# Patient Record
Sex: Male | Born: 1995 | Race: Black or African American | Hispanic: No | Marital: Single
Health system: Southern US, Community
[De-identification: ages and names within clinical notes are randomized; demographics above are authoritative.]

---

## 2019-02-04 ENCOUNTER — Emergency Department (HOSPITAL_COMMUNITY): Payer: Medicare Other

## 2019-02-04 ENCOUNTER — Emergency Department (HOSPITAL_COMMUNITY)
Admission: EM | Admit: 2019-02-04 | Discharge: 2019-02-05 | Disposition: A | Discharge status: Home / Self Care | Payer: Medicare Other | Attending: Emergency Medicine | Admitting: Emergency Medicine

## 2019-02-04 ENCOUNTER — Other Ambulatory Visit: Payer: Self-pay

## 2019-02-04 DIAGNOSIS — Z5900 Homelessness unspecified: Secondary | ICD-10-CM

## 2019-02-04 DIAGNOSIS — F329 Major depressive disorder, single episode, unspecified: Secondary | ICD-10-CM | POA: Diagnosis not present

## 2019-02-04 DIAGNOSIS — S62339A Displaced fracture of neck of unspecified metacarpal bone, initial encounter for closed fracture: Secondary | ICD-10-CM

## 2019-02-04 DIAGNOSIS — Y929 Unspecified place or not applicable: Secondary | ICD-10-CM | POA: Insufficient documentation

## 2019-02-04 DIAGNOSIS — Y999 Unspecified external cause status: Secondary | ICD-10-CM | POA: Diagnosis not present

## 2019-02-04 DIAGNOSIS — W2209XA Striking against other stationary object, initial encounter: Secondary | ICD-10-CM | POA: Diagnosis not present

## 2019-02-04 DIAGNOSIS — S6991XA Unspecified injury of right wrist, hand and finger(s), initial encounter: Secondary | ICD-10-CM | POA: Diagnosis present

## 2019-02-04 DIAGNOSIS — Y9389 Activity, other specified: Secondary | ICD-10-CM | POA: Diagnosis not present

## 2019-02-04 DIAGNOSIS — S62346A Nondisplaced fracture of base of fifth metacarpal bone, right hand, initial encounter for closed fracture: Secondary | ICD-10-CM | POA: Diagnosis not present

## 2019-02-04 LAB — CBC WITH DIFFERENTIAL/PLATELET
Abs Immature Granulocytes: 0.07 10*3/uL (ref 0.00–0.07)
Basophils Absolute: 0.1 10*3/uL (ref 0.0–0.1)
Basophils Relative: 1 %
Eosinophils Absolute: 0.1 10*3/uL (ref 0.0–0.5)
Eosinophils Relative: 1 %
HCT: 42.7 % (ref 39.0–52.0)
Hemoglobin: 13.2 g/dL (ref 13.0–17.0)
Immature Granulocytes: 1 %
Lymphocytes Relative: 25 %
Lymphs Abs: 2.2 10*3/uL (ref 0.7–4.0)
MCH: 25.6 pg — ABNORMAL LOW (ref 26.0–34.0)
MCHC: 30.9 g/dL (ref 30.0–36.0)
MCV: 82.8 fL (ref 80.0–100.0)
Monocytes Absolute: 0.6 10*3/uL (ref 0.1–1.0)
Monocytes Relative: 7 %
Neutro Abs: 5.6 10*3/uL (ref 1.7–7.7)
Neutrophils Relative %: 65 %
Platelets: 324 10*3/uL (ref 150–400)
RBC: 5.16 MIL/uL (ref 4.22–5.81)
RDW: 15.2 % (ref 11.5–15.5)
WBC: 8.7 10*3/uL (ref 4.0–10.5)
nRBC: 0 % (ref 0.0–0.2)

## 2019-02-04 LAB — COMPREHENSIVE METABOLIC PANEL
ALT: 25 U/L (ref 0–44)
AST: 25 U/L (ref 15–41)
Albumin: 3.8 g/dL (ref 3.5–5.0)
Alkaline Phosphatase: 97 U/L (ref 38–126)
Anion gap: 8 (ref 5–15)
BUN: 7 mg/dL (ref 6–20)
CO2: 22 mmol/L (ref 22–32)
Calcium: 9.1 mg/dL (ref 8.9–10.3)
Chloride: 110 mmol/L (ref 98–111)
Creatinine, Ser: 0.98 mg/dL (ref 0.61–1.24)
GFR calc Af Amer: >60 mL/min (ref >60)
GFR calc non Af Amer: >60 mL/min (ref >60)
Glucose, Bld: 98 mg/dL (ref 70–99)
Potassium: 3.9 mmol/L (ref 3.5–5.1)
Sodium: 140 mmol/L (ref 135–145)
Total Bilirubin: 0.4 mg/dL (ref 0.3–1.2)
Total Protein: 8.1 g/dL (ref 6.5–8.1)

## 2019-02-04 LAB — RAPID URINE DRUG SCREEN, HOSP PERFORMED
Amphetamines: NOT DETECTED
Barbiturates: NOT DETECTED
Benzodiazepines: NOT DETECTED
Cocaine: NOT DETECTED
Opiates: NOT DETECTED
Tetrahydrocannabinol: NOT DETECTED

## 2019-02-04 LAB — ETHANOL: Alcohol, Ethyl (B): <10 mg/dL (ref <10)

## 2019-02-04 MED ORDER — ACETAMINOPHEN 500 MG PO TABS
1000.0000 mg | ORAL_TABLET | Freq: Once | ORAL | Status: AC
  Administered 2019-02-04: 23:00:00 1000 mg via ORAL
  Filled 2019-02-04: qty 2
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
## 2019-02-04 NOTE — ED Triage Notes (Signed)
Pt here from home with c/o feeling depressed , no SI or HI , pt states that he just feels "down and out "

## 2019-02-05 ENCOUNTER — Emergency Department (HOSPITAL_COMMUNITY)
Admission: EM | Admit: 2019-02-05 | Discharge: 2019-02-05 | Disposition: A | Discharge status: Home / Self Care | Payer: Medicare Other | Source: Home / Self Care | Attending: Emergency Medicine | Admitting: Emergency Medicine

## 2019-02-05 ENCOUNTER — Other Ambulatory Visit: Payer: Self-pay

## 2019-02-05 DIAGNOSIS — S62339D Displaced fracture of neck of unspecified metacarpal bone, subsequent encounter for fracture with routine healing: Secondary | ICD-10-CM

## 2019-02-05 DIAGNOSIS — Z5900 Homelessness unspecified: Secondary | ICD-10-CM

## 2019-02-05 DIAGNOSIS — G8911 Acute pain due to trauma: Secondary | ICD-10-CM | POA: Insufficient documentation

## 2019-02-05 DIAGNOSIS — S62346A Nondisplaced fracture of base of fifth metacarpal bone, right hand, initial encounter for closed fracture: Secondary | ICD-10-CM | POA: Diagnosis not present

## 2019-02-05 NOTE — Discharge Instructions (Addendum)
You were seen in the ER for right hand pain.  X-ray showed a fracture of your right metacarpal.  This is called a boxer's fracture.  The treatment for this includes a hand splint.  Wear this and do not get it wet until you are evaluated by the hand specialist.  Call Dr. Lequita Asal office tomorrow to make an appointment. For pain and inflammation you can use a combination of ibuprofen and acetaminophen.  Take [phone] mg acetaminophen (tylenol) every 6 hours or 600 mg ibuprofen (advil, motrin) every 6 hours.  You can take these separately or combine them every 6 hours for maximum pain control. Do not exceed 4,000 mg acetaminophen or 2,400 mg ibuprofen in a 24 hour period.  Do not take ibuprofen containing products if you have history of kidney disease, ulcers, GI bleeding, severe acid reflux, or take a blood thinner.  Do not take acetaminophen if you have liver disease.   Unfortunately, social work was not available at this time of the night.  We have given you some resources that he can use in the community.

## 2019-02-05 NOTE — ED Notes (Addendum)
Pt. States seeking placement. Pt. Was discharged yesterday to seek shelter at [facility] in [place]. Per pt, when arrived was informed shelter would not be accepting any new residents for the next 3 days. Pt returns to the ED seeking shelter.  Pt. Was seen ED yesterday due to right hand injury DX closed boxer's fracture. At this time pt. Denies any pain. Pt. States "my hand still feels numb."

## 2019-02-05 NOTE — ED Notes (Signed)
Patient verbalizes understanding of discharge instructions. Opportunity for questioning and answers were provided. Armband removed by staff, pt discharged from ED.  

## 2019-02-05 NOTE — ED Provider Notes (Signed)
[place] EMERGENCY DEPARTMENT Provider Note   CSN: [id number] Arrival date & time: 02/05/19  0340    History   Chief Complaint No chief complaint on file.   HPI Joshua Clarke is a 23 y.o. male.     Patient presents to the ER stating that he needs a place to stay.  Patient was seen in the ER earlier today for boxer's fracture of the right hand.  At that time he related that he was homeless and he was given a cab voucher to be taken to [facility].  He reports that when he got there, he was informed that they were full and he could not stay there.  He has come back seeking help with his homelessness.  He reports that he has been walking most of the day and his hand hurts.     No past medical history on file.  There are no active problems to display for this patient.         Home Medications    Prior to Admission medications   Not on File    Family History No family history on file.  Social History Social History   Tobacco Use  . Smoking status: Not on file  Substance Use Topics  . Alcohol use: Not on file  . Drug use: Not on file     Allergies   Patient has no allergy information on record.   Review of Systems Review of Systems  Musculoskeletal: Positive for arthralgias.  All other systems reviewed and are negative.    Physical Exam Updated Vital Signs BP 131/82   Pulse (!) 106   Resp 19   SpO2 98%   Physical Exam Vitals signs and nursing note reviewed.  Constitutional:      General: He is not in acute distress.    Appearance: Normal appearance. He is well-developed.  HENT:     Head: Normocephalic and atraumatic.     Right Ear: Hearing normal.     Left Ear: Hearing normal.     Nose: Nose normal.  Eyes:     Conjunctiva/sclera: Conjunctivae normal.     Pupils: Pupils are equal, round, and reactive to light.  Neck:     Musculoskeletal: Normal range of motion and neck supple.  Cardiovascular:     Rate and Rhythm:  Regular rhythm.     Heart sounds: S1 normal and S2 normal. No murmur. No friction rub. No gallop.   Pulmonary:     Effort: Pulmonary effort is normal. No respiratory distress.     Breath sounds: Normal breath sounds.  Chest:     Chest wall: No tenderness.  Abdominal:     General: Bowel sounds are normal.     Palpations: Abdomen is soft.     Tenderness: There is no abdominal tenderness. There is no guarding or rebound. Negative signs include Murphy's sign and McBurney's sign.     Hernia: No hernia is present.  Musculoskeletal: Normal range of motion.     Comments: Right hand and wrist splinted  Skin:    General: Skin is warm and dry.     Findings: No rash.  Neurological:     Mental Status: He is alert and oriented to person, place, and time.     GCS: GCS eye subscore is 4. GCS verbal subscore is 5. GCS motor subscore is 6.     Cranial Nerves: No cranial nerve deficit.     Sensory: No sensory deficit.  Coordination: Coordination normal.  Psychiatric:        Speech: Speech normal.        Behavior: Behavior normal.        Thought Content: Thought content normal.      ED Treatments / Results  Labs (all labs ordered are listed, but only abnormal results are displayed) Labs Reviewed - No data to display  EKG None  Radiology Dg Hand Complete Right  Result Date: 02/04/2019 CLINICAL DATA:  Pain. EXAM: RIGHT HAND - COMPLETE 3+ VIEW COMPARISON:  None. FINDINGS: There is an acute angulated fracture involving the distal fifth metacarpal. There is surrounding soft tissue swelling. There is no dislocation. There is no radiopaque foreign body. IMPRESSION: Acute angulated boxer's fracture of the fifth metacarpal Electronically Signed   By: Katherine Mantlehristopher  Green M.D.   On: 02/04/2019 22:37    Procedures Procedures (including critical care time)  Medications Ordered in ED Medications - No data to display   Initial Impression / Assessment and Plan / ED Course  I have reviewed the  triage vital signs and the nursing notes.  Pertinent labs & imaging results that were available during my care of the patient were reviewed by me and considered in my medical decision making (see chart for details).        Patient presents with homelessness.  Attempts to help him earlier today were not successful.  He will be held here in the ER until morning at which time social work can help him find a shelter.  Final Clinical Impressions(s) / ED Diagnoses   Final diagnoses:  Homeless  Closed boxer's fracture with routine healing, subsequent encounter    ED Discharge Orders    None       Shakti Fleer, Canary Brimhristopher J, MD 02/05/19 [phone]

## 2019-02-05 NOTE — ED Notes (Signed)
Pt d/c with belongings, taken to [place] by CM.

## 2019-02-05 NOTE — ED Triage Notes (Signed)
Pt. Arrives seeking resources for shelter
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
## 2019-02-05 NOTE — ED Notes (Signed)
Lunch tray ordered 
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
## 2019-02-05 NOTE — ED Notes (Signed)
SW/CM called Dr. Tyrone Nine to d/c pt.

## 2020-11-27 IMAGING — DX RIGHT HAND - COMPLETE 3+ VIEW
1 series · 3 of 3 positions shown · non-contrast
Comparison: None.

CLINICAL DATA: Pain.

EXAM:
RIGHT HAND - COMPLETE 3+ VIEW

[Series 1: hand · 0.14mm/px · 3 of 3 slices shown]
[im 1/3]
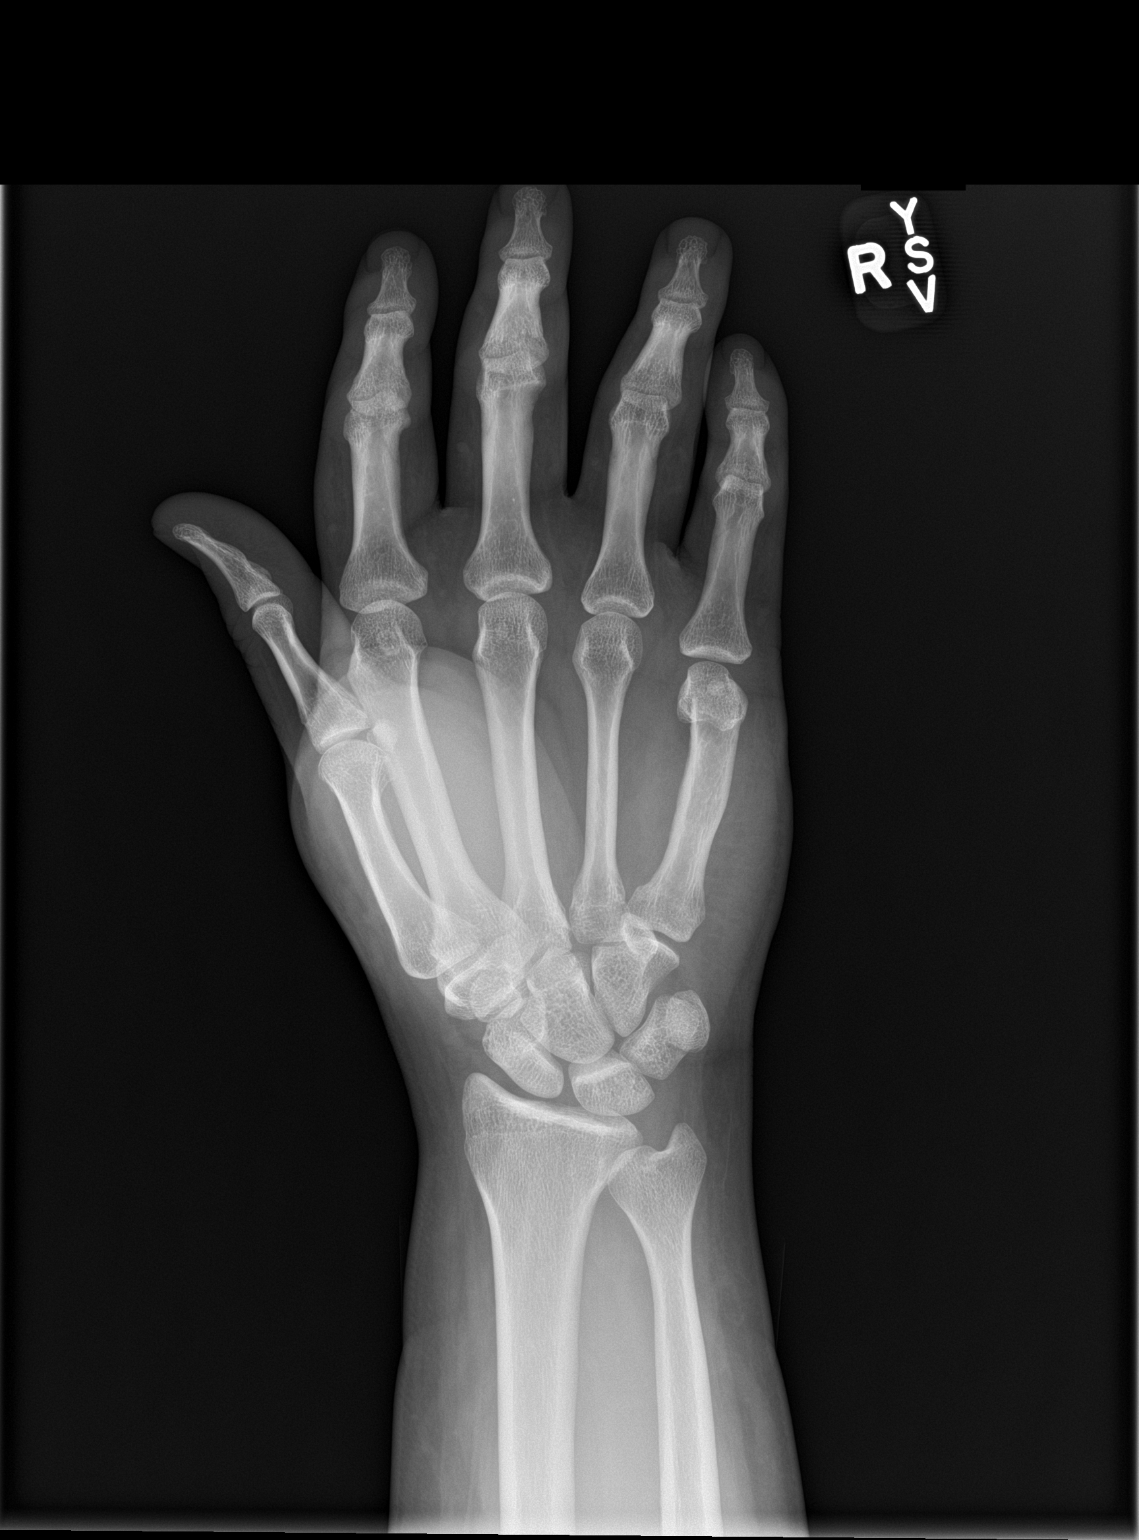
[im 2/3]
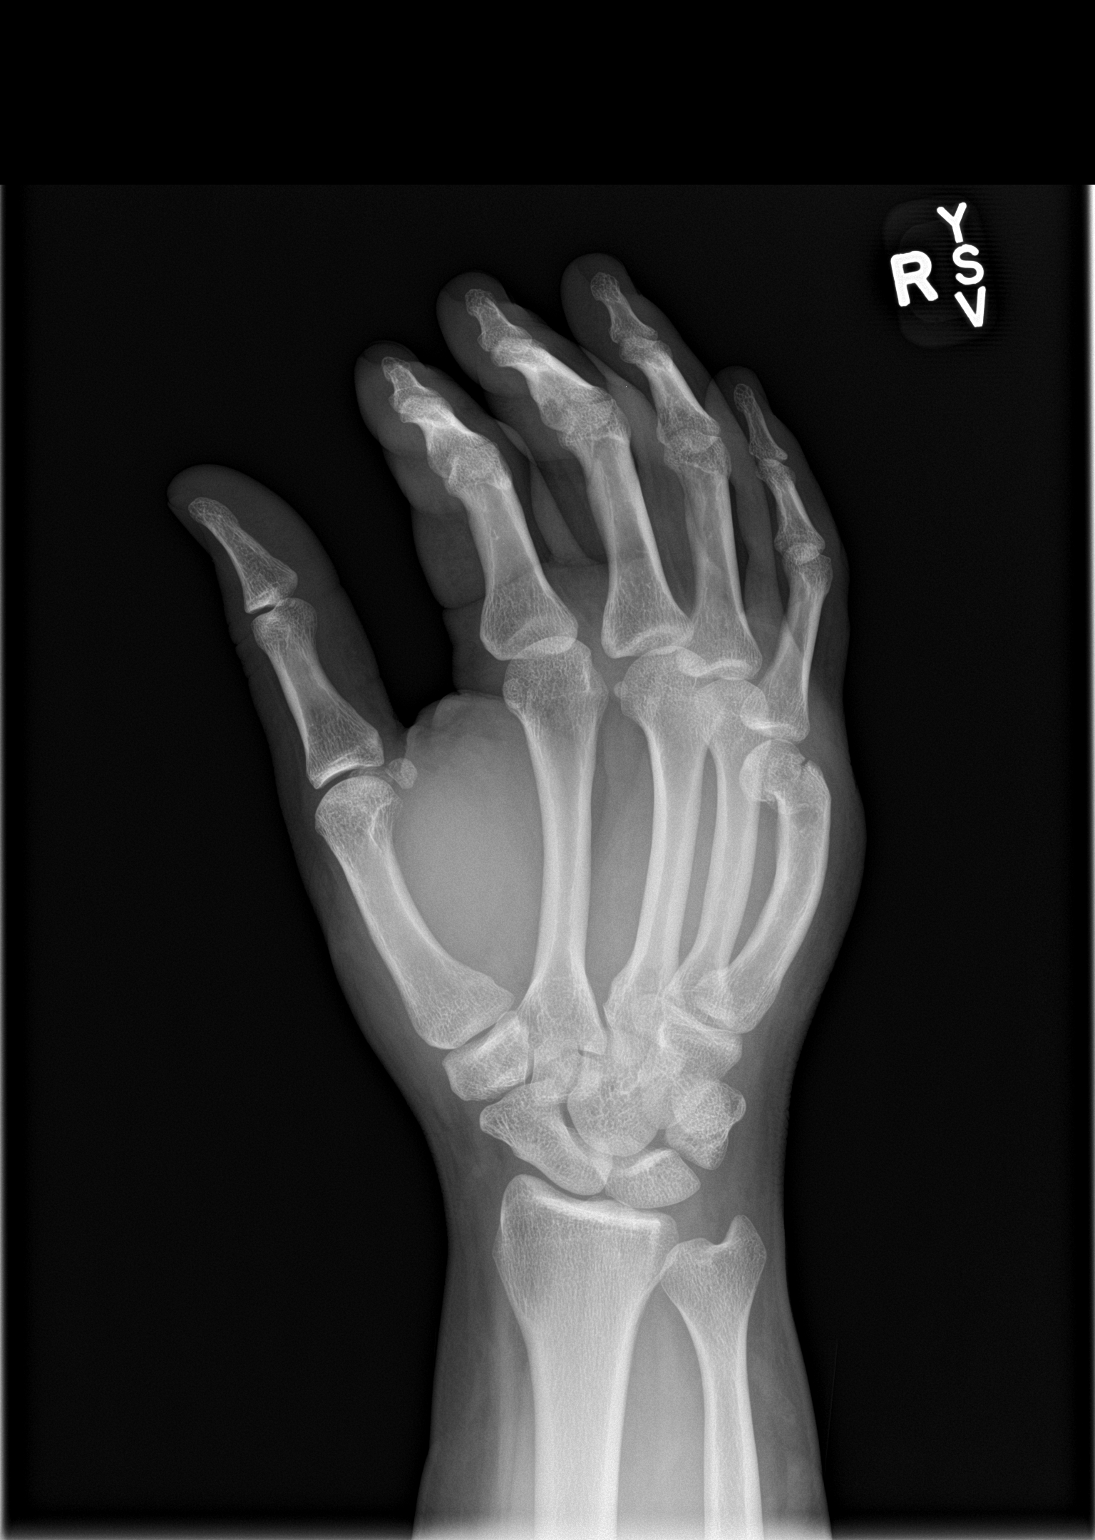
[im 3/3]
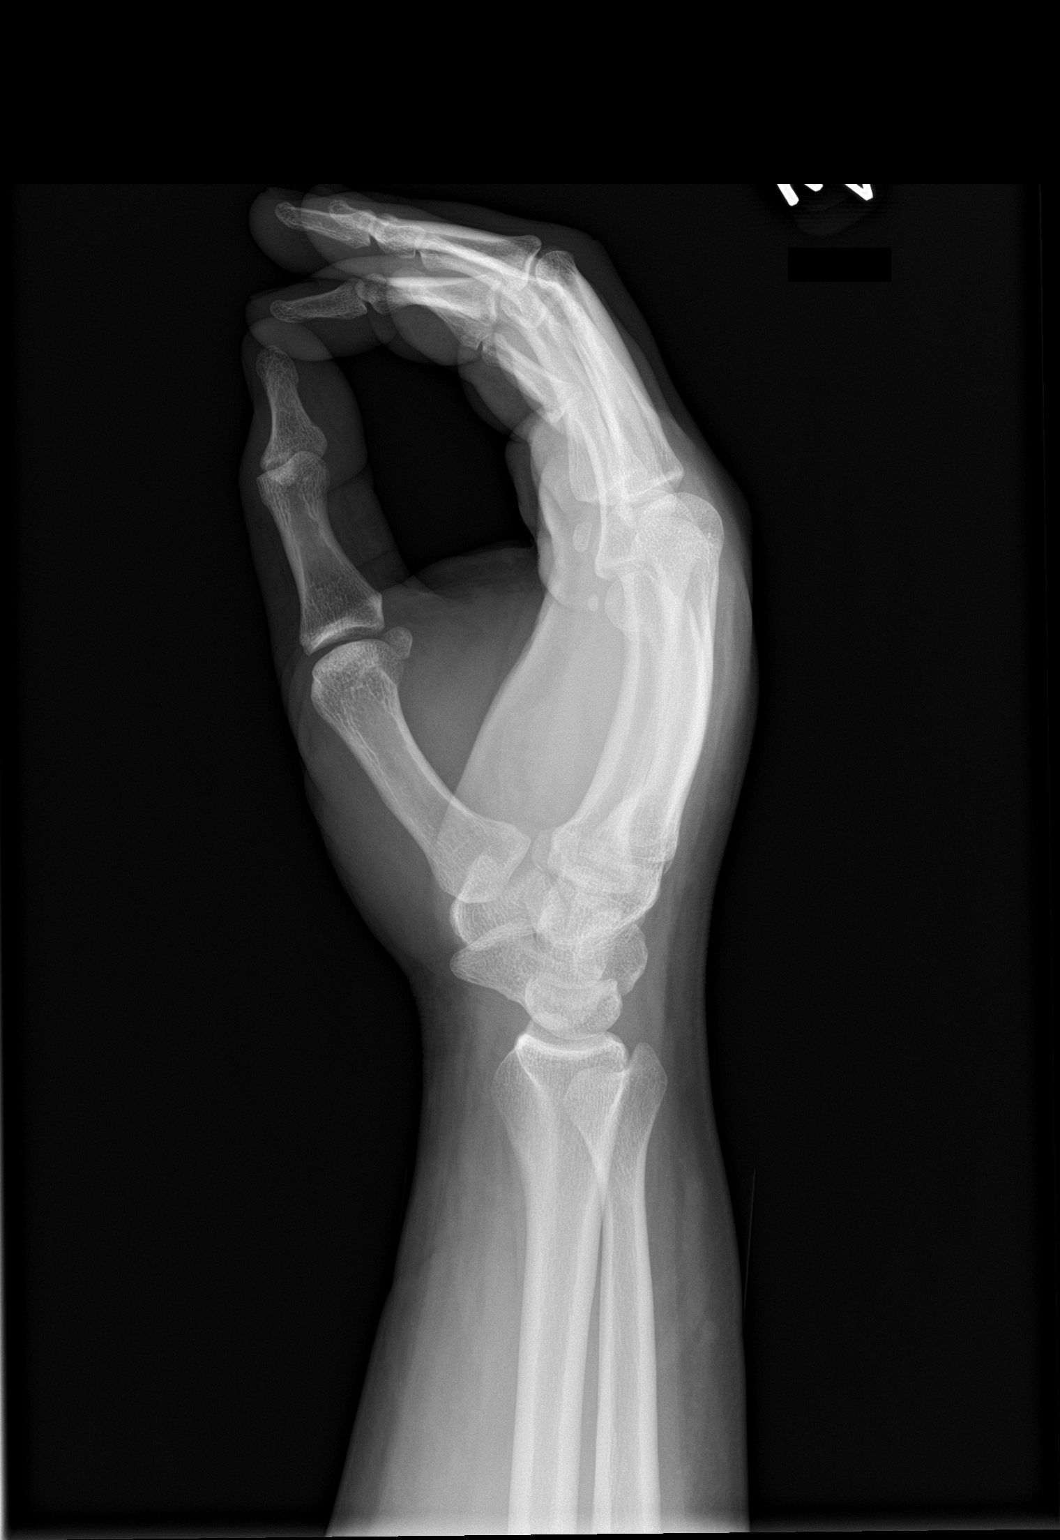

[3 of 3 positions shown; findings below may reference images not displayed]

FINDINGS: There is an acute angulated fracture involving the distal fifth
metacarpal. There is surrounding soft tissue swelling. There is no
dislocation. There is no radiopaque foreign body.
IMPRESSION: Acute angulated boxer's fracture of the fifth metacarpal
